# Patient Record
Sex: Male | Born: 1990 | Race: White | Hispanic: Yes | Smoking: Current every day smoker
Health system: Southern US, Community
[De-identification: ages and names within clinical notes are randomized; demographics above are authoritative.]

## PROBLEM LIST (undated history)

## (undated) DIAGNOSIS — M549 Dorsalgia, unspecified: Secondary | ICD-10-CM

---

## 2015-01-24 ENCOUNTER — Emergency Department (HOSPITAL_BASED_OUTPATIENT_CLINIC_OR_DEPARTMENT_OTHER)
Admission: EM | Admit: 2015-01-24 | Discharge: 2015-01-24 | Disposition: A | Discharge status: Home / Self Care | Payer: Self-pay | Attending: Emergency Medicine | Admitting: Emergency Medicine

## 2015-01-24 ENCOUNTER — Emergency Department (HOSPITAL_BASED_OUTPATIENT_CLINIC_OR_DEPARTMENT_OTHER): Payer: Self-pay

## 2015-01-24 ENCOUNTER — Encounter (HOSPITAL_BASED_OUTPATIENT_CLINIC_OR_DEPARTMENT_OTHER): Payer: Self-pay

## 2015-01-24 DIAGNOSIS — W01198A Fall on same level from slipping, tripping and stumbling with subsequent striking against other object, initial encounter: Secondary | ICD-10-CM | POA: Insufficient documentation

## 2015-01-24 DIAGNOSIS — M545 Low back pain, unspecified: Secondary | ICD-10-CM

## 2015-01-24 DIAGNOSIS — M25521 Pain in right elbow: Secondary | ICD-10-CM

## 2015-01-24 DIAGNOSIS — Z720 Tobacco use: Secondary | ICD-10-CM | POA: Insufficient documentation

## 2015-01-24 DIAGNOSIS — S6991XA Unspecified injury of right wrist, hand and finger(s), initial encounter: Secondary | ICD-10-CM | POA: Insufficient documentation

## 2015-01-24 DIAGNOSIS — S3992XA Unspecified injury of lower back, initial encounter: Secondary | ICD-10-CM | POA: Insufficient documentation

## 2015-01-24 DIAGNOSIS — R0781 Pleurodynia: Secondary | ICD-10-CM

## 2015-01-24 DIAGNOSIS — S59901A Unspecified injury of right elbow, initial encounter: Secondary | ICD-10-CM | POA: Insufficient documentation

## 2015-01-24 DIAGNOSIS — Y9289 Other specified places as the place of occurrence of the external cause: Secondary | ICD-10-CM | POA: Insufficient documentation

## 2015-01-24 DIAGNOSIS — Y998 Other external cause status: Secondary | ICD-10-CM | POA: Insufficient documentation

## 2015-01-24 DIAGNOSIS — S299XXA Unspecified injury of thorax, initial encounter: Secondary | ICD-10-CM | POA: Insufficient documentation

## 2015-01-24 DIAGNOSIS — Y9389 Activity, other specified: Secondary | ICD-10-CM | POA: Insufficient documentation

## 2015-01-24 DIAGNOSIS — M79641 Pain in right hand: Secondary | ICD-10-CM

## 2015-01-24 HISTORY — DX: Dorsalgia, unspecified: M54.9

## 2015-01-24 MED ORDER — KETOROLAC TROMETHAMINE 60 MG/2ML IM SOLN
60.0000 mg | Freq: Once | INTRAMUSCULAR | Status: AC
  Administered 2015-01-24: 60 mg via INTRAMUSCULAR
  Filled 2015-01-24: qty 2
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
## 2015-01-24 NOTE — Discharge Instructions (Signed)
You may take ibuprofen, tylenol or naproxen for your pain.   Back Pain, Adult Low back pain is very common. About 1 in 5 people have back pain.The cause of low back pain is rarely dangerous. The pain often gets better over time.About half of people with a sudden onset of back pain feel better in just 2 weeks. About 8 in 10 people feel better by 6 weeks.  CAUSES Some common causes of back pain include:  Strain of the muscles or ligaments supporting the spine.  Wear and tear (degeneration) of the spinal discs.  Arthritis.  Direct injury to the back. DIAGNOSIS Most of the time, the direct cause of low back pain is not known.However, back pain can be treated effectively even when the exact cause of the pain is unknown.Answering your caregiver's questions about your overall health and symptoms is one of the most accurate ways to make sure the cause of your pain is not dangerous. If your caregiver needs more information, he or she may order lab work or imaging tests (X-rays or MRIs).However, even if imaging tests show changes in your back, this usually does not require surgery. HOME CARE INSTRUCTIONS For many people, back pain returns.Since low back pain is rarely dangerous, it is often a condition that people can learn to [facility] their own.   Remain active. It is stressful on the back to sit or stand in one place. Do not sit, drive, or stand in one place for more than 30 minutes at a time. Take short walks on level surfaces as soon as pain allows.Try to increase the length of time you walk each day.  Do not stay in bed.Resting more than 1 or 2 days can delay your recovery.  Do not avoid exercise or work.Your body is made to move.It is not dangerous to be active, even though your back may hurt.Your back will likely heal faster if you return to being active before your pain is gone.  Pay attention to your body when you bend and lift. Many people have less discomfortwhen lifting if  they bend their knees, keep the load close to their bodies,and avoid twisting. Often, the most comfortable positions are those that put less stress on your recovering back.  Find a comfortable position to sleep. Use a firm mattress and lie on your side with your knees slightly bent. If you lie on your back, put a pillow under your knees.  Only take over-the-counter or prescription medicines as directed by your caregiver. Over-the-counter medicines to reduce pain and inflammation are often the most helpful.Your caregiver may prescribe muscle relaxant drugs.These medicines help dull your pain so you can more quickly return to your normal activities and healthy exercise.  Put ice on the injured area.  Put ice in a plastic bag.  Place a towel between your skin and the bag.  Leave the ice on for 15-20 minutes, 03-04 times a day for the first 2 to 3 days. After that, ice and heat may be alternated to reduce pain and spasms.  Ask your caregiver about trying back exercises and gentle massage. This may be of some benefit.  Avoid feeling anxious or stressed.Stress increases muscle tension and can worsen back pain.It is important to recognize when you are anxious or stressed and learn ways to manage it.Exercise is a great option. SEEK MEDICAL CARE IF:  You have pain that is not relieved with rest or medicine.  You have pain that does not improve in 1 week.  You have new symptoms.  You are generally not feeling well. SEEK IMMEDIATE MEDICAL CARE IF:   You have pain that radiates from your back into your legs.  You develop new bowel or bladder control problems.  You have unusual weakness or numbness in your arms or legs.  You develop nausea or vomiting.  You develop abdominal pain.  You feel faint. Document Released: 07/16/2005 Document Revised: 01/15/2012 Document Reviewed: 11/17/2013 [facility] Patient Information 2015 [place], [place]. This information is not intended to replace  advice given to you by your health care provider. Make sure you discuss any questions you have with your health care provider.  Heat Therapy Heat therapy can help ease sore, stiff, injured, and tight muscles and joints. Heat relaxes your muscles, which may help ease your pain.  RISKS AND COMPLICATIONS If you have any of the following conditions, do not use heat therapy unless your health care provider has approved:  Poor circulation.  Healing wounds or scarred skin in the area being treated.  Diabetes, heart disease, or high blood pressure.  Not being able to feel (numbness) the area being treated.  Unusual swelling of the area being treated.  Active infections.  Blood clots.  Cancer.  Inability to communicate pain. This may include young children and people who have problems with their brain function (dementia).  Pregnancy. Heat therapy should only be used on old, pre-existing, or long-lasting (chronic) injuries. Do not use heat therapy on new injuries unless directed by your health care provider. HOW TO USE HEAT THERAPY There are several different kinds of heat therapy, including:  Moist heat pack.  Warm water bath.  Hot water bottle.  Electric heating pad.  Heated gel pack.  Heated wrap.  Electric heating pad. Use the heat therapy method suggested by your health care provider. Follow your health care provider's instructions on when and how to use heat therapy. GENERAL HEAT THERAPY RECOMMENDATIONS  Do not sleep while using heat therapy. Only use heat therapy while you are awake.  Your skin may turn pink while using heat therapy. Do not use heat therapy if your skin turns red.  Do not use heat therapy if you have new pain.  High heat or long exposure to heat can cause burns. Be careful when using heat therapy to avoid burning your skin.  Do not use heat therapy on areas of your skin that are already irritated, such as with a rash or sunburn. SEEK MEDICAL CARE  IF:  You have blisters, redness, swelling, or numbness.  You have new pain.  Your pain is worse. MAKE SURE YOU:  Understand these instructions.  Will watch your condition.  Will get help right away if you are not doing well or get worse. Document Released: 10/08/2011 Document Revised: 11/30/2013 Document Reviewed: 09/08/2013 [facility] Patient Information 2015 [place], [place]. This information is not intended to replace advice given to you by your health care provider. Make sure you discuss any questions you have with your health care provider.  Musculoskeletal Pain Musculoskeletal pain is muscle and boney aches and pains. These pains can occur in any part of the body. Your caregiver may treat you without knowing the cause of the pain. They may treat you if blood or urine tests, X-rays, and other tests were normal.  CAUSES There is often not a definite cause or reason for these pains. These pains may be caused by a type of germ (virus). The discomfort may also come from overuse. Overuse includes working out too  hard when your body is not fit. Boney aches also come from weather changes. Bone is sensitive to atmospheric pressure changes. HOME CARE INSTRUCTIONS   Ask when your test results will be ready. Make sure you get your test results.  Only take over-the-counter or prescription medicines for pain, discomfort, or fever as directed by your caregiver. If you were given medications for your condition, do not drive, operate machinery or power tools, or sign legal documents for 24 hours. Do not drink alcohol. Do not take sleeping pills or other medications that may interfere with treatment.  Continue all activities unless the activities cause more pain. When the pain lessens, slowly resume normal activities. Gradually increase the intensity and duration of the activities or exercise.  During periods of severe pain, bed rest may be helpful. Lay or sit in any position that is  comfortable.  Putting ice on the injured area.  Put ice in a bag.  Place a towel between your skin and the bag.  Leave the ice on for 15 to 20 minutes, 3 to 4 times a day.  Follow up with your caregiver for continued problems and no reason can be found for the pain. If the pain becomes worse or does not go away, it may be necessary to repeat tests or do additional testing. Your caregiver may need to look further for a possible cause. SEEK IMMEDIATE MEDICAL CARE IF:  You have pain that is getting worse and is not relieved by medications.  You develop chest pain that is associated with shortness or breath, sweating, feeling sick to your stomach (nauseous), or throw up (vomit).  Your pain becomes localized to the abdomen.  You develop any new symptoms that seem different or that concern you. MAKE SURE YOU:   Understand these instructions.  Will watch your condition.  Will get help right away if you are not doing well or get worse. Document Released: 07/16/2005 Document Revised: 10/08/2011 Document Reviewed: 03/20/2013 [facility] Patient Information 2015 [place], [place]. This information is not intended to replace advice given to you by your health care provider. Make sure you discuss any questions you have with your health care provider.  RICE: Routine Care for Injuries The routine care of many injuries includes Rest, Ice, Compression, and Elevation (RICE). HOME CARE INSTRUCTIONS  Rest is needed to allow your body to heal. Routine activities can usually be resumed when comfortable. Injured tendons and bones can take up to 6 weeks to heal. Tendons are the cord-like structures that attach muscle to bone.  Ice following an injury helps keep the swelling down and reduces pain.  Put ice in a plastic bag.  Place a towel between your skin and the bag.  Leave the ice on for 15-20 minutes, 3-4 times a day, or as directed by your health care provider. Do this while awake, for the first 24  to 48 hours. After that, continue as directed by your caregiver.  Compression helps keep swelling down. It also gives support and helps with discomfort. If an elastic bandage has been applied, it should be removed and reapplied every 3 to 4 hours. It should not be applied tightly, but firmly enough to keep swelling down. Watch fingers or toes for swelling, bluish discoloration, coldness, numbness, or excessive pain. If any of these problems occur, remove the bandage and reapply loosely. Contact your caregiver if these problems continue.  Elevation helps reduce swelling and decreases pain. With extremities, such as the arms, hands, legs, and feet, the  injured area should be placed near or above the level of the heart, if possible. SEEK IMMEDIATE MEDICAL CARE IF:  You have persistent pain and swelling.  You develop redness, numbness, or unexpected weakness.  Your symptoms are getting worse rather than improving after several days. These symptoms may indicate that further evaluation or further X-rays are needed. Sometimes, X-rays may not show a small broken bone (fracture) until 1 week or 10 days later. Make a follow-up appointment with your caregiver. Ask when your X-ray results will be ready. Make sure you get your X-ray results. Document Released: 10/28/2000 Document Revised: 07/21/2013 Document Reviewed: 12/15/2010 [facility] Patient Information 2015 [place], [place]. This information is not intended to replace advice given to you by your health care provider. Make sure you discuss any questions you have with your health care provider.

## 2015-05-23 ENCOUNTER — Encounter (HOSPITAL_BASED_OUTPATIENT_CLINIC_OR_DEPARTMENT_OTHER): Payer: Self-pay | Admitting: *Deleted

## 2015-05-23 ENCOUNTER — Emergency Department (HOSPITAL_BASED_OUTPATIENT_CLINIC_OR_DEPARTMENT_OTHER)
Admission: EM | Admit: 2015-05-23 | Discharge: 2015-05-23 | Discharge status: Against Medical Advice | Payer: No Typology Code available for payment source | Attending: Emergency Medicine | Admitting: Emergency Medicine

## 2015-05-23 DIAGNOSIS — S3992XA Unspecified injury of lower back, initial encounter: Secondary | ICD-10-CM | POA: Insufficient documentation

## 2015-05-23 DIAGNOSIS — M25572 Pain in left ankle and joints of left foot: Secondary | ICD-10-CM | POA: Insufficient documentation

## 2015-05-23 DIAGNOSIS — Y92410 Unspecified street and highway as the place of occurrence of the external cause: Secondary | ICD-10-CM | POA: Diagnosis not present

## 2015-05-23 DIAGNOSIS — Z720 Tobacco use: Secondary | ICD-10-CM | POA: Diagnosis not present

## 2015-05-23 DIAGNOSIS — M25571 Pain in right ankle and joints of right foot: Secondary | ICD-10-CM | POA: Insufficient documentation

## 2015-05-23 DIAGNOSIS — Y998 Other external cause status: Secondary | ICD-10-CM | POA: Insufficient documentation

## 2015-05-23 DIAGNOSIS — Y9389 Activity, other specified: Secondary | ICD-10-CM | POA: Insufficient documentation
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
## 2015-05-23 NOTE — ED Notes (Signed)
Called for pt in [facility] but he did not respond.  Not able to find pt in ED at this time.

## 2015-05-23 NOTE — ED Notes (Signed)
No answer when called for room 

## 2016-04-21 IMAGING — DX DG ELBOW COMPLETE 3+V*R*
4 series · 4 of 4 positions shown · non-contrast
Comparison: None.

CLINICAL DATA: Status post fall 01/22/2015. Continued right elbow
pain. Initial encounter.

EXAM:
RIGHT ELBOW - COMPLETE 3+ VIEW

[elbow ap]
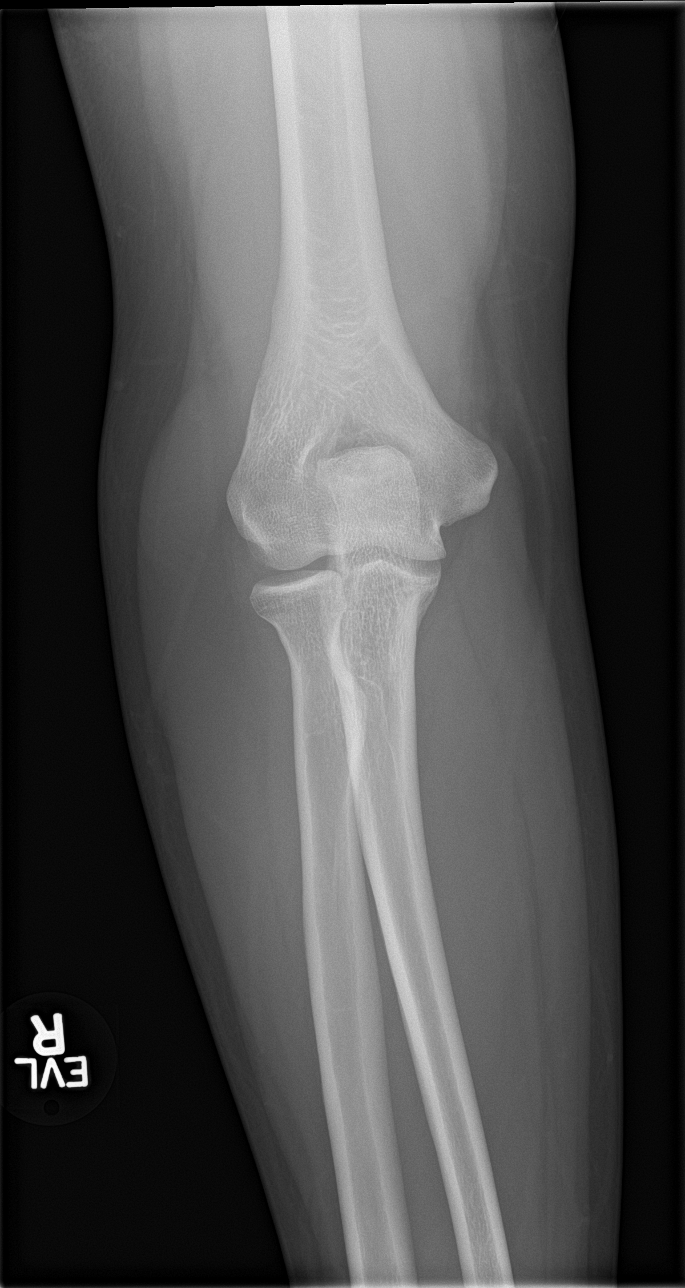

[elbow obl (1 of 2)]
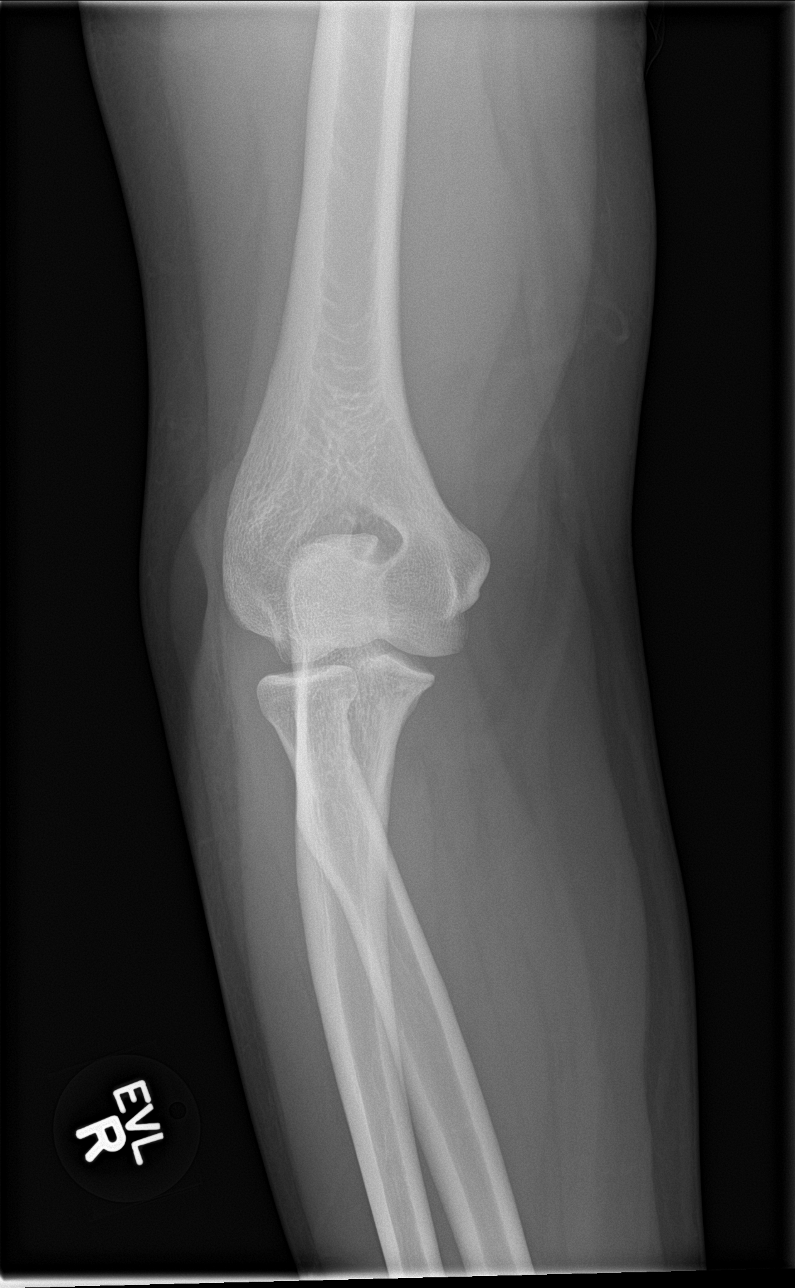

[elbow obl (2 of 2)]
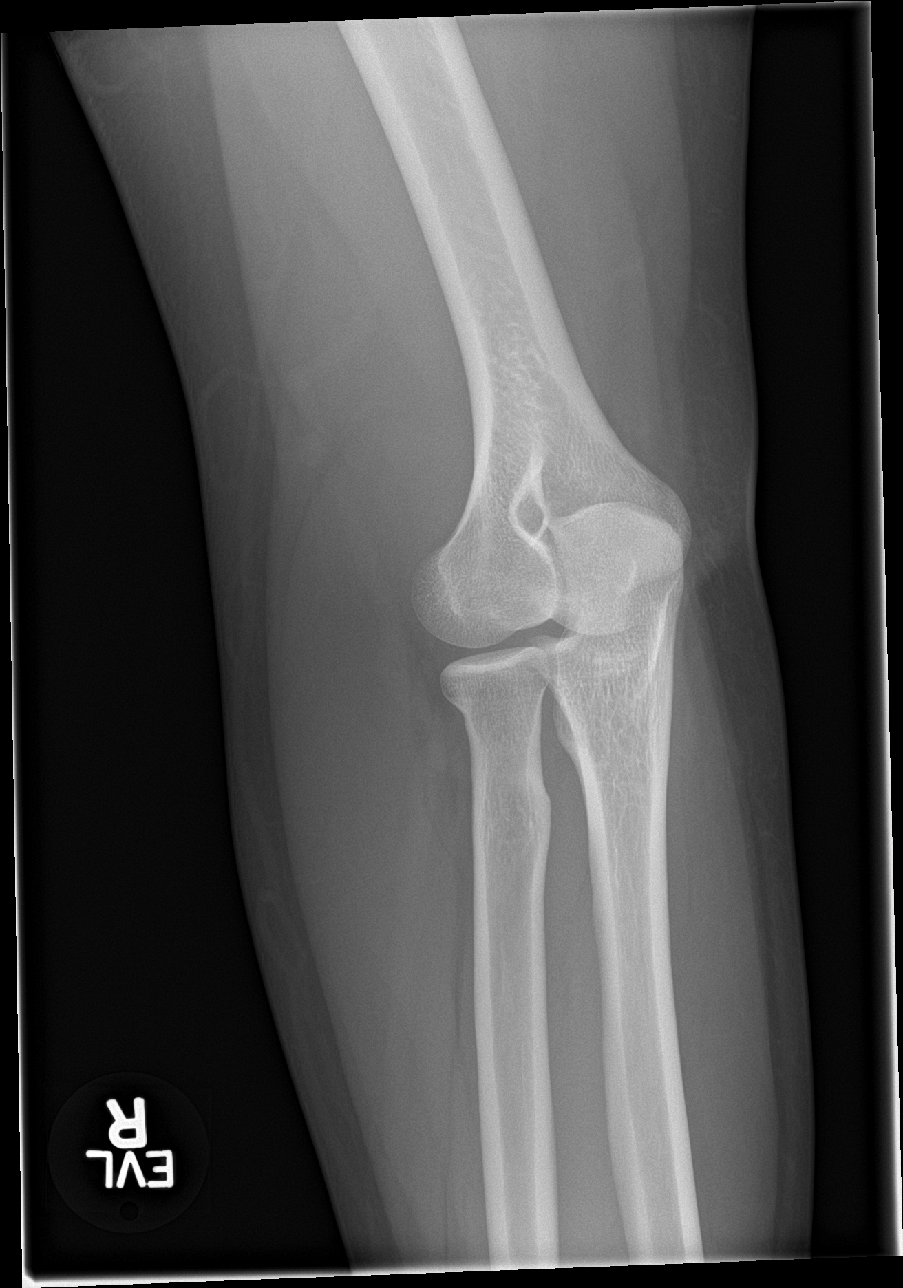

[elbow lat]
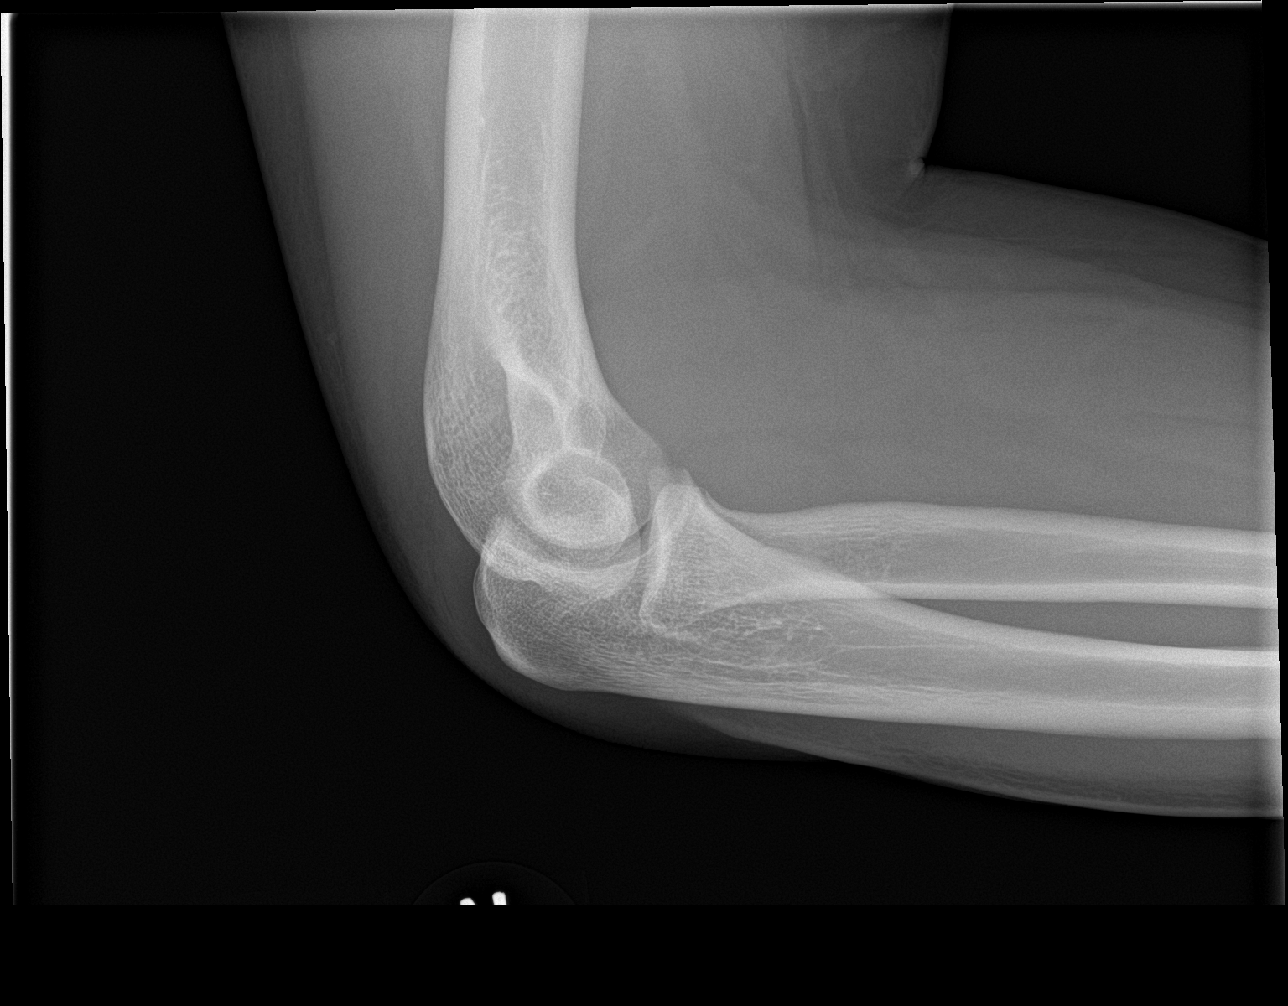

[4 of 4 positions shown; findings below may reference images not displayed]

FINDINGS: Imaged bones, joints and soft tissues appear normal.
IMPRESSION: Negative exam.
